# Patient Record
Sex: Female | Born: 1990 | Race: Black or African American | Hispanic: No | Marital: Single | Smoking: Never smoker
Health system: Southern US, Community
[De-identification: ages and names within clinical notes are randomized; demographics above are authoritative.]

---

## 2015-02-12 ENCOUNTER — Encounter (HOSPITAL_COMMUNITY): Payer: Self-pay | Admitting: Adult Health

## 2015-02-12 ENCOUNTER — Emergency Department (HOSPITAL_COMMUNITY)
Admission: EM | Admit: 2015-02-12 | Discharge: 2015-02-12 | Disposition: A | Discharge status: Home / Self Care | Payer: Medicaid Other | Attending: Emergency Medicine | Admitting: Emergency Medicine

## 2015-02-12 DIAGNOSIS — Y929 Unspecified place or not applicable: Secondary | ICD-10-CM | POA: Insufficient documentation

## 2015-02-12 DIAGNOSIS — Y93G3 Activity, cooking and baking: Secondary | ICD-10-CM | POA: Diagnosis not present

## 2015-02-12 DIAGNOSIS — T23131A Burn of first degree of multiple right fingers (nail), not including thumb, initial encounter: Secondary | ICD-10-CM

## 2015-02-12 DIAGNOSIS — Y998 Other external cause status: Secondary | ICD-10-CM | POA: Insufficient documentation

## 2015-02-12 DIAGNOSIS — X102XXA Contact with fats and cooking oils, initial encounter: Secondary | ICD-10-CM | POA: Diagnosis not present

## 2015-02-12 DIAGNOSIS — T23031A Burn of unspecified degree of multiple right fingers (nail), not including thumb, initial encounter: Secondary | ICD-10-CM | POA: Diagnosis present

## 2015-02-12 MED ORDER — TRAMADOL HCL 50 MG PO TABS: 50.0000 mg | ORAL_TABLET | Freq: Four times a day (QID) | ORAL | Status: DC | PRN

## 2015-02-12 MED ORDER — NEOMY-BACIT-POLYMYX-PRAMOXINE 1 % EX OINT: TOPICAL_OINTMENT | CUTANEOUS | Status: DC

## 2015-02-12 MED ORDER — IBUPROFEN 600 MG PO TABS: 600.0000 mg | ORAL_TABLET | Freq: Four times a day (QID) | ORAL | Status: DC | PRN

## 2015-02-12 MED ORDER — IBUPROFEN 200 MG PO TABS
600.0000 mg | ORAL_TABLET | Freq: Once | ORAL | Status: AC
  Administered 2015-02-12: 600 mg via ORAL
  Filled 2015-02-12: qty 3

## 2015-02-12 MED ORDER — OXYCODONE-ACETAMINOPHEN 5-325 MG PO TABS
1.0000 | ORAL_TABLET | Freq: Once | ORAL | Status: AC
  Administered 2015-02-12: 1 via ORAL
  Filled 2015-02-12: qty 1

## 2015-02-12 NOTE — Discharge Instructions (Signed)
Ibuprofen for pain. Tramadol for severe pain only. Apply Neosporin with pain relief topically to the fingertips 2-3 times a day. Follow-up with an urgent care or primary care doctor for recheck in 3 days.    Burn Care Your skin is a natural barrier to infection. It is the largest organ of your body. Burns damage this natural protection. To help prevent infection, it is very important to follow your caregiver's instructions in the care of your burn. Burns are classified as:  First degree. There is only redness of the skin (erythema). No scarring is expected.  Second degree. There is blistering of the skin. Scarring may occur with deeper burns.  Third degree. All layers of the skin are injured, and scarring is expected. HOME CARE INSTRUCTIONS   Wash your hands well before changing your bandage.  Change your bandage as often as directed by your caregiver.  Remove the old bandage. If the bandage sticks, you may soak it off with cool, clean water.  Cleanse the burn thoroughly but gently with mild soap and water.  Pat the area dry with a clean, dry cloth.  Apply a thin layer of antibacterial cream to the burn.  Apply a clean bandage as instructed by your caregiver.  Keep the bandage as clean and dry as possible.  Elevate the affected area for the first 24 hours, then as instructed by your caregiver.  Only take over-the-counter or prescription medicines for pain, discomfort, or fever as directed by your caregiver. SEEK IMMEDIATE MEDICAL CARE IF:   You develop excessive pain.  You develop redness, tenderness, swelling, or red streaks near the burn.  The burned area develops yellowish-white fluid (pus) or a bad smell.  You have a fever. MAKE SURE YOU:   Understand these instructions.  Will watch your condition.  Will get help right away if you are not doing well or get worse. Document Released: 09/16/2005 Document Revised: 12/09/2011 Document Reviewed: 02/06/2011 [facility]  Patient Information 2015 [place], [place]. This information is not intended to replace advice given to you by your health care provider. Make sure you discuss any questions you have with your health care provider.
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
## 2015-05-11 ENCOUNTER — Emergency Department (HOSPITAL_BASED_OUTPATIENT_CLINIC_OR_DEPARTMENT_OTHER): Payer: Medicaid Other

## 2015-05-11 ENCOUNTER — Emergency Department (HOSPITAL_BASED_OUTPATIENT_CLINIC_OR_DEPARTMENT_OTHER)
Admission: EM | Admit: 2015-05-11 | Discharge: 2015-05-11 | Disposition: A | Discharge status: Home / Self Care | Payer: Medicaid Other | Attending: Emergency Medicine | Admitting: Emergency Medicine

## 2015-05-11 ENCOUNTER — Encounter (HOSPITAL_BASED_OUTPATIENT_CLINIC_OR_DEPARTMENT_OTHER): Payer: Self-pay

## 2015-05-11 DIAGNOSIS — Z3202 Encounter for pregnancy test, result negative: Secondary | ICD-10-CM | POA: Diagnosis not present

## 2015-05-11 DIAGNOSIS — R197 Diarrhea, unspecified: Secondary | ICD-10-CM

## 2015-05-11 LAB — URINALYSIS, ROUTINE W REFLEX MICROSCOPIC
Bilirubin Urine: NEGATIVE
Glucose, UA: NEGATIVE mg/dL
Ketones, ur: NEGATIVE mg/dL
Leukocytes, UA: NEGATIVE
Nitrite: NEGATIVE
Protein, ur: NEGATIVE mg/dL
Specific Gravity, Urine: 1.005 (ref 1.005–1.030)
UROBILINOGEN UA: 0.2 mg/dL (ref 0.0–1.0)
pH: 7 (ref 5.0–8.0)

## 2015-05-11 LAB — URINE MICROSCOPIC-ADD ON

## 2015-05-11 LAB — CBC WITH DIFFERENTIAL/PLATELET
Basophils Absolute: 0 10*3/uL (ref 0.0–0.1)
Basophils Relative: 0 % (ref 0–1)
Eosinophils Absolute: 0.2 10*3/uL (ref 0.0–0.7)
Eosinophils Relative: 3 % (ref 0–5)
HCT: 35.2 % — ABNORMAL LOW (ref 36.0–46.0)
Hemoglobin: 11.9 g/dL — ABNORMAL LOW (ref 12.0–15.0)
Lymphocytes Relative: 58 % — ABNORMAL HIGH (ref 12–46)
Lymphs Abs: 3.6 10*3/uL (ref 0.7–4.0)
MCH: 29.8 pg (ref 26.0–34.0)
MCHC: 33.8 g/dL (ref 30.0–36.0)
MCV: 88 fL (ref 78.0–100.0)
Monocytes Absolute: 0.6 10*3/uL (ref 0.1–1.0)
Monocytes Relative: 10 % (ref 3–12)
NEUTROS ABS: 1.7 10*3/uL (ref 1.7–7.7)
NEUTROS PCT: 29 % — AB (ref 43–77)
PLATELETS: 262 10*3/uL (ref 150–400)
RBC: 4 MIL/uL (ref 3.87–5.11)
RDW: 12.8 % (ref 11.5–15.5)
WBC: 6.1 10*3/uL (ref 4.0–10.5)

## 2015-05-11 LAB — COMPREHENSIVE METABOLIC PANEL
ALBUMIN: 3.4 g/dL — AB (ref 3.5–5.0)
ALT: 11 U/L — ABNORMAL LOW (ref 14–54)
ANION GAP: 6 (ref 5–15)
AST: 15 U/L (ref 15–41)
Alkaline Phosphatase: 49 U/L (ref 38–126)
BUN: 8 mg/dL (ref 6–20)
CO2: 27 mmol/L (ref 22–32)
CREATININE: 0.49 mg/dL (ref 0.44–1.00)
Calcium: 8.4 mg/dL — ABNORMAL LOW (ref 8.9–10.3)
Chloride: 106 mmol/L (ref 101–111)
GFR calc Af Amer: >60 mL/min (ref >60)
GFR calc non Af Amer: >60 mL/min (ref >60)
Glucose, Bld: 110 mg/dL — ABNORMAL HIGH (ref 65–99)
POTASSIUM: 3.5 mmol/L (ref 3.5–5.1)
Sodium: 139 mmol/L (ref 135–145)
Total Bilirubin: 0.8 mg/dL (ref 0.3–1.2)
Total Protein: 6.2 g/dL — ABNORMAL LOW (ref 6.5–8.1)

## 2015-05-11 LAB — LIPASE, BLOOD: Lipase: 26 U/L (ref 22–51)

## 2015-05-11 LAB — PREGNANCY, URINE: Preg Test, Ur: NEGATIVE

## 2015-05-11 MED ORDER — ONDANSETRON HCL 4 MG/2ML IJ SOLN
4.0000 mg | Freq: Once | INTRAMUSCULAR | Status: AC
  Administered 2015-05-11: 4 mg via INTRAVENOUS
  Filled 2015-05-11: qty 2

## 2015-05-11 MED ORDER — PANTOPRAZOLE SODIUM 40 MG IV SOLR
40.0000 mg | Freq: Once | INTRAVENOUS | Status: AC
  Administered 2015-05-11: 40 mg via INTRAVENOUS
  Filled 2015-05-11: qty 40

## 2015-05-11 MED ORDER — SUCRALFATE 1 G PO TABS
1.0000 g | ORAL_TABLET | Freq: Once | ORAL | Status: AC
  Administered 2015-05-11: 1 g via ORAL
  Filled 2015-05-11: qty 1

## 2015-05-11 MED ORDER — LOPERAMIDE HCL 2 MG PO CAPS
2.0000 mg | ORAL_CAPSULE | Freq: Once | ORAL | Status: AC
  Administered 2015-05-11: 2 mg via ORAL
  Filled 2015-05-11: qty 1

## 2015-05-11 MED ORDER — SODIUM CHLORIDE 0.9 % IV BOLUS (SEPSIS)
1000.0000 mL | Freq: Once | INTRAVENOUS | Status: AC
  Administered 2015-05-11: 1000 mL via INTRAVENOUS

## 2015-05-11 NOTE — ED Notes (Signed)
Assumed care of patient from Katrina Morrow, Pt awaiting test results. VSS. No distress. No complaints.

## 2015-05-11 NOTE — ED Notes (Signed)
Patient transported to X-ray 

## 2015-05-11 NOTE — ED Notes (Signed)
MD at bedside. 

## 2015-05-11 NOTE — ED Provider Notes (Signed)
CSN: [id number]     Arrival date & time 05/11/15  0015 History   First MD Initiated Contact with Patient 05/11/15 0041     Chief Complaint  Patient presents with  . Diarrhea     (Consider location/radiation/quality/duration/timing/severity/associated sxs/prior Treatment) HPI  This is a 24 year old female who has had a cough for about a week. She was placed on an unspecified antibiotics which she has been taking for 5 days. The antibiotics is taken once daily. She was advised to take it on a full stomach but has been taking it on an empty stomach. She is here with a three-day history of diarrhea. The diarrhea is brown and watery. There is no blood she has also been nauseated but has not vomited. She has had a decreased appetite. She has also had a one-day history of epigastric and left upper quadrant pain. She has not had a fever.   History reviewed. No pertinent past medical history. History reviewed. No pertinent past surgical history. History reviewed. No pertinent family history. Social History  Substance Use Topics  . Smoking status: Never Smoker   . Smokeless tobacco: None  . Alcohol Use: No   OB History    No data available     Review of Systems  All other systems reviewed and are negative.   Allergies  Review of patient's allergies indicates no known allergies.  Home Medications   Prior to Admission medications   Medication Sig Start Date End Date Taking? Authorizing Provider  ibuprofen (ADVIL,MOTRIN) 600 MG tablet Take 1 tablet (600 mg total) by mouth every 6 (six) hours as needed. 02/12/15   Tatyana Kirichenko, PA-C  Neomy-Bacit-Polymyx-Pramoxine (NEOSPORIN + PAIN RELIEF MAX ST) 1 % OINT Apply to burned  Fingers twice a day. 02/12/15   Tatyana Kirichenko, PA-C  traMADol (ULTRAM) 50 MG tablet Take 1 tablet (50 mg total) by mouth every 6 (six) hours as needed. 02/12/15   Tatyana Kirichenko, PA-C   BP 100/64 mmHg  Pulse 88  Temp(Src) 98.3 F (36.8 C) (Oral)  Resp 18   Ht  (1.499 m)  Wt 125 lb (56.7 kg)  BMI 25.23 kg/m2  SpO2 99%  LMP 05/10/2015   Physical Exam  General: Well-developed, well-nourished female in no acute distress; appearance consistent with age of record HENT: normocephalic; atraumatic Eyes: pupils equal, round and reactive to light; extraocular muscles intact Neck: supple Heart: regular rate and rhythm Lungs: clear to auscultation bilaterally Abdomen: soft; nondistended; epigastric and left upper quadrant; no masses or hepatosplenomegaly; bowel sounds present Extremities: No deformity; full range of motion; pulses normal Neurologic: Awake, alert and oriented; motor function intact in all extremities and symmetric; no facial droop Skin: Warm and dry Psychiatric: Flat affect    ED Course  Procedures (including critical care time)   MDM  Nursing notes and vitals signs, including pulse oximetry, reviewed.  Summary of this visit's results, reviewed by myself:  Labs:  Results for orders placed or performed during the hospital encounter of 05/11/15 (from the past 24 hour(s))  CBC with Differential/Platelet     Status: Abnormal   Collection Time: 05/11/15 12:48 AM  Result Value Ref Range   WBC 6.1 4.0 - 10.5 K/uL   RBC 4.00 3.87 - 5.11 MIL/uL   Hemoglobin 11.9 (L) 12.0 - 15.0 g/dL   HCT 40.9 (L) 81.1 - 91.4 %   MCV 88.0 78.0 - 100.0 fL   MCH 29.8 26.0 - 34.0 pg   MCHC 33.8 30.0 - 36.0 g/dL  RDW 12.8 11.5 - 15.5 %   Platelets 262 150 - 400 K/uL   Neutrophils Relative % 29 (L) 43 - 77 %   Neutro Abs 1.7 1.7 - 7.7 K/uL   Lymphocytes Relative 58 (H) 12 - 46 %   Lymphs Abs 3.6 0.7 - 4.0 K/uL   Monocytes Relative 10 3 - 12 %   Monocytes Absolute 0.6 0.1 - 1.0 K/uL   Eosinophils Relative 3 0 - 5 %   Eosinophils Absolute 0.2 0.0 - 0.7 K/uL   Basophils Relative 0 0 - 1 %   Basophils Absolute 0.0 0.0 - 0.1 K/uL  Lipase, blood     Status: None   Collection Time: 05/11/15 12:48 AM  Result Value Ref Range   Lipase 26 22 -  51 U/L  Comprehensive metabolic panel     Status: Abnormal   Collection Time: 05/11/15 12:48 AM  Result Value Ref Range   Sodium 139 135 - 145 mmol/L   Potassium 3.5 3.5 - 5.1 mmol/L   Chloride 106 101 - 111 mmol/L   CO2 27 22 - 32 mmol/L   Glucose, Bld 110 (H) 65 - 99 mg/dL   BUN 8 6 - 20 mg/dL   Creatinine, Ser 9.60 0.44 - 1.00 mg/dL   Calcium 8.4 (L) 8.9 - 10.3 mg/dL   Total Protein 6.2 (L) 6.5 - 8.1 g/dL   Albumin 3.4 (L) 3.5 - 5.0 g/dL   AST 15 15 - 41 U/L   ALT 11 (L) 14 - 54 U/L   Alkaline Phosphatase 49 38 - 126 U/L   Total Bilirubin 0.8 0.3 - 1.2 mg/dL   GFR calc non Af Amer >60 >60 mL/min   GFR calc Af Amer >60 >60 mL/min   Anion gap 6 5 - 15  Urinalysis, Routine w reflex microscopic (not at [place])     Status: Abnormal   Collection Time: 05/11/15  1:45 AM  Result Value Ref Range   Color, Urine YELLOW YELLOW   APPearance CLEAR CLEAR   Specific Gravity, Urine 1.005 1.005 - 1.030   pH 7.0 5.0 - 8.0   Glucose, UA NEGATIVE NEGATIVE mg/dL   Hgb urine dipstick TRACE (A) NEGATIVE   Bilirubin Urine NEGATIVE NEGATIVE   Ketones, ur NEGATIVE NEGATIVE mg/dL   Protein, ur NEGATIVE NEGATIVE mg/dL   Urobilinogen, UA 0.2 0.0 - 1.0 mg/dL   Nitrite NEGATIVE NEGATIVE   Leukocytes, UA NEGATIVE NEGATIVE  Pregnancy, urine     Status: None   Collection Time: 05/11/15  1:45 AM  Result Value Ref Range   Preg Test, Ur NEGATIVE NEGATIVE  Urine microscopic-add on     Status: None   Collection Time: 05/11/15  1:45 AM  Result Value Ref Range   Squamous Epithelial / LPF RARE RARE   WBC, UA 0-2 <3 WBC/hpf   RBC / HPF 0-2 <3 RBC/hpf   Bacteria, UA RARE RARE    Imaging Studies: Dg Chest 2 View  05/11/2015   CLINICAL DATA:  Acute onset of generalized abdominal pain and diarrhea. Cough. Initial encounter.  EXAM: CHEST  2 VIEW  COMPARISON:  None.  FINDINGS: The lungs are well-aerated and clear. There is no evidence of focal opacification, pleural effusion or pneumothorax.  The heart is normal  in size; the mediastinal contour is within normal limits. No acute osseous abnormalities are seen.  IMPRESSION: No acute cardiopulmonary process seen.   Electronically Signed   By: Roanna Raider M.D.   On: 05/11/2015 02:15  2:29 AM Patient feeling much better after IV fluids and Carafate. She was advised to take her antibiotics with food as instructed. It's likely she is having some gastritis related to the antibiotic. She was advised to take over-the-counter Imodium as needed for diarrhea.    Paula Libra, MD 05/11/15 0230

## 2015-05-11 NOTE — ED Notes (Addendum)
Pt reports diarrhea, nausea since yesterday. Pt also c/o cough and being on antibiotic and 3 other medications for cough but cannot recall the name of any of the medications.

## 2016-06-25 ENCOUNTER — Encounter (HOSPITAL_COMMUNITY): Payer: Self-pay | Admitting: Emergency Medicine

## 2016-06-25 ENCOUNTER — Emergency Department (HOSPITAL_COMMUNITY)
Admission: EM | Admit: 2016-06-25 | Discharge: 2016-06-25 | Disposition: A | Discharge status: Home / Self Care | Payer: Medicaid Other | Attending: Emergency Medicine | Admitting: Emergency Medicine

## 2016-06-25 DIAGNOSIS — H6121 Impacted cerumen, right ear: Secondary | ICD-10-CM

## 2016-06-25 DIAGNOSIS — J069 Acute upper respiratory infection, unspecified: Secondary | ICD-10-CM

## 2016-06-25 DIAGNOSIS — R059 Cough, unspecified: Secondary | ICD-10-CM

## 2016-06-25 DIAGNOSIS — R05 Cough: Secondary | ICD-10-CM

## 2016-06-25 DIAGNOSIS — H9201 Otalgia, right ear: Secondary | ICD-10-CM | POA: Diagnosis present

## 2016-06-25 MED ORDER — SODIUM CHLORIDE 0.9 % IV BOLUS (SEPSIS)
1000.0000 mL | Freq: Once | INTRAVENOUS | Status: AC
Start: 2016-06-25 — End: 2016-06-25
  Administered 2016-06-25: 1000 mL via INTRAVENOUS

## 2016-06-25 MED ORDER — BENZONATATE 100 MG PO CAPS: 100.0000 mg | ORAL_CAPSULE | Freq: Three times a day (TID) | ORAL | 0 refills | Status: AC | PRN

## 2016-06-25 MED ORDER — ONDANSETRON HCL 4 MG/2ML IJ SOLN
4.0000 mg | Freq: Once | INTRAMUSCULAR | Status: AC
  Administered 2016-06-25: 4 mg via INTRAVENOUS
  Filled 2016-06-25: qty 2

## 2016-06-25 MED ORDER — ACETAMINOPHEN 500 MG PO TABS
1000.0000 mg | ORAL_TABLET | Freq: Once | ORAL | Status: AC
  Administered 2016-06-25: 1000 mg via ORAL
  Filled 2016-06-25: qty 2

## 2016-06-25 MED ORDER — ONDANSETRON HCL 4 MG PO TABS: 4.0000 mg | ORAL_TABLET | Freq: Three times a day (TID) | ORAL | 0 refills | Status: AC | PRN

## 2016-06-25 MED ORDER — HYDROGEN PEROXIDE 3 % EX SOLN
Freq: Once | CUTANEOUS | Status: AC
  Administered 2016-06-25: 09:00:00 via TOPICAL
  Filled 2016-06-25: qty 473

## 2016-06-25 NOTE — ED Triage Notes (Signed)
Pt. reports persistent dry cough with chest congestion and right ear ache onset last week .

## 2016-06-25 NOTE — ED Provider Notes (Signed)
[facility] Provider Note   CSN: [id number] Arrival date & time: 06/25/16  7829     History   Chief Complaint Chief Complaint  Patient presents with  . Cough  . Otalgia    HPI Katrina Morrow is a 25 y.o. female.  The history is provided by the patient.  Otalgia  This is a new problem. The current episode started 2 days ago. There is pain in the right ear. The problem occurs constantly. The problem has been gradually worsening. The maximum temperature recorded prior to her arrival was 100 to 100.9 F. The fever has been present for 1 to 2 days. The pain is moderate. Associated symptoms include sore throat and cough. Pertinent negatives include no ear discharge, no headaches, no abdominal pain, no diarrhea, no vomiting and no rash.    History reviewed. No pertinent past medical history.  There are no active problems to display for this patient.   History reviewed. No pertinent surgical history.  OB History    No data available       Home Medications    Prior to Admission medications   Not on File    Family History No family history on file.  Social History Social History  Substance Use Topics  . Smoking status: Never Smoker  . Smokeless tobacco: Never Used  . Alcohol use No     Allergies   Review of patient's allergies indicates no known allergies.   Review of Systems Review of Systems  Constitutional: Positive for fever. Negative for chills.  HENT: Positive for ear pain and sore throat. Negative for ear discharge.   Eyes: Negative for pain and visual disturbance.  Respiratory: Positive for cough. Negative for shortness of breath.   Cardiovascular: Negative for chest pain and palpitations.  Gastrointestinal: Positive for nausea. Negative for abdominal pain, diarrhea and vomiting.  Genitourinary: Negative for dysuria and hematuria.  Musculoskeletal: Negative for arthralgias and back pain.  Skin: Negative for color change and rash.  Neurological:  Positive for light-headedness. Negative for seizures, syncope and headaches.  All other systems reviewed and are negative.    Physical Exam Updated Vital Signs BP 105/68 (BP Location: Right Arm)   Pulse 73   Temp 100.4 F (38 C) (Oral)   Resp 16   Ht 5\' 1"  (1.549 m)   Wt 62.6 kg   LMP 06/21/2016   SpO2 100%   BMI 26.07 kg/m   Physical Exam  Constitutional: She is oriented to person, place, and time. She appears well-developed and well-nourished. No distress.  HENT:  Head: Normocephalic and atraumatic.  Bilateral cerumen impaction. After right ear cerumen removal, TM was inspected and intact without signs of infection.  Eyes: Conjunctivae and EOM are normal. Pupils are equal, round, and reactive to light.  Neck: Neck supple.  Cardiovascular: Normal rate and regular rhythm.   Pulmonary/Chest: Effort normal and breath sounds normal. No respiratory distress.  Abdominal: Soft. There is no tenderness.  Musculoskeletal: She exhibits no edema.  Neurological: She is alert and oriented to person, place, and time.  Skin: Skin is warm and dry.  Psychiatric: She has a normal mood and affect.  Nursing note and vitals reviewed.    ED Treatments / Results  Labs (all labs ordered are listed, but only abnormal results are displayed) Labs Reviewed - No data to display  EKG  EKG Interpretation  Date/Time:  Tuesday June 25 2016 08:47:00 EDT Ventricular Rate:  63 PR Interval:    QRS Duration: 91 QT  Interval:  445 QTC Calculation: 456 R Axis:   74 Text Interpretation:  Sinus rhythm Borderline Q waves in inferior leads Borderline T abnormalities, anterior leads no previous Confirmed by ZACKOWSKI  MD, SCOTT [phone]([id number]) on 06/25/2016 8:50:12 AM Also confirmed by Deretha EmoryZACKOWSKI  MD, SCOTT [phone]), editor Whitney PostLOGAN, Cala BradfordKIMBERLY [phone])  on 06/25/2016 9:28:12 AM       Radiology No results found.  Procedures .Ear Cerumen Removal Date/Time: 06/25/2016 9:36 AM Performed by: Garey HamEAN, Loneta Tamplin  E Authorized by: Jaquita RectorEAN, Clarrissa Shimkus E   Consent:    Consent obtained:  Verbal   Consent given by:  Patient Procedure details:    Location:  R ear   Procedure type: irrigation     Procedure type comment:  And curette Post-procedure details:    Inspection:  TM intact   Hearing quality:  Normal   Patient tolerance of procedure:  Tolerated well, no immediate complications   (including critical care time)  Medications Ordered in ED Medications  sodium chloride 0.9 % bolus 1,000 mL (not administered)  ondansetron (ZOFRAN) injection 4 mg (not administered)  hydrogen peroxide 3 % external solution (not administered)     Initial Impression / Assessment and Plan / ED Course  I have reviewed the triage vital signs and the nursing notes.  Pertinent labs & imaging results that were available during my care of the patient were reviewed by me and considered in my medical decision making (see chart for details).  Clinical Course   Ms. Kandis MannanOmar presents for 2 days of cough, fever, light headedness, anorexia and right sided otalgia.  Patient given oral pain medication, IV fluids, and zofran with relief of symptoms. No longer lightheaded or nauseated.   EKG obtained, no concerning features.  Patient likely has URI with cough and nausea.  Right ear was disimpacted as above and found to have no signs of infection.  Patient requested to not have left ear cleaned; is not having pain to the left ear.  Patient is discharged with strict return precautions, follow up instructions, educational materials and prescriptions for zofran and tessalon pearls.  Final Clinical Impressions(s) / ED Diagnoses   Final diagnoses:  Cerumen impaction, right  URI (upper respiratory infection)  Cough    New Prescriptions New Prescriptions   BENZONATATE (TESSALON) 100 MG CAPSULE    Take 1 capsule (100 mg total) by mouth 3 (three) times daily as needed for cough.   ONDANSETRON (ZOFRAN) 4 MG TABLET    Take 1 tablet (4 mg  total) by mouth every 8 (eight) hours as needed for nausea or vomiting.     Garey HamMichael E Nyra Anspaugh, MD 06/25/16 [phone]    Vanetta MuldersScott Zackowski, MD 06/25/16 [phone]    Vanetta MuldersScott Zackowski, MD 06/25/16 [phone]

## 2017-02-19 IMAGING — CR DG CHEST 2V
2 series · 2 of 2 positions shown · non-contrast
Comparison: None.

CLINICAL DATA: Acute onset of generalized abdominal pain and
diarrhea. Cough. Initial encounter.

EXAM:
CHEST  2 VIEW

[w chest pa]
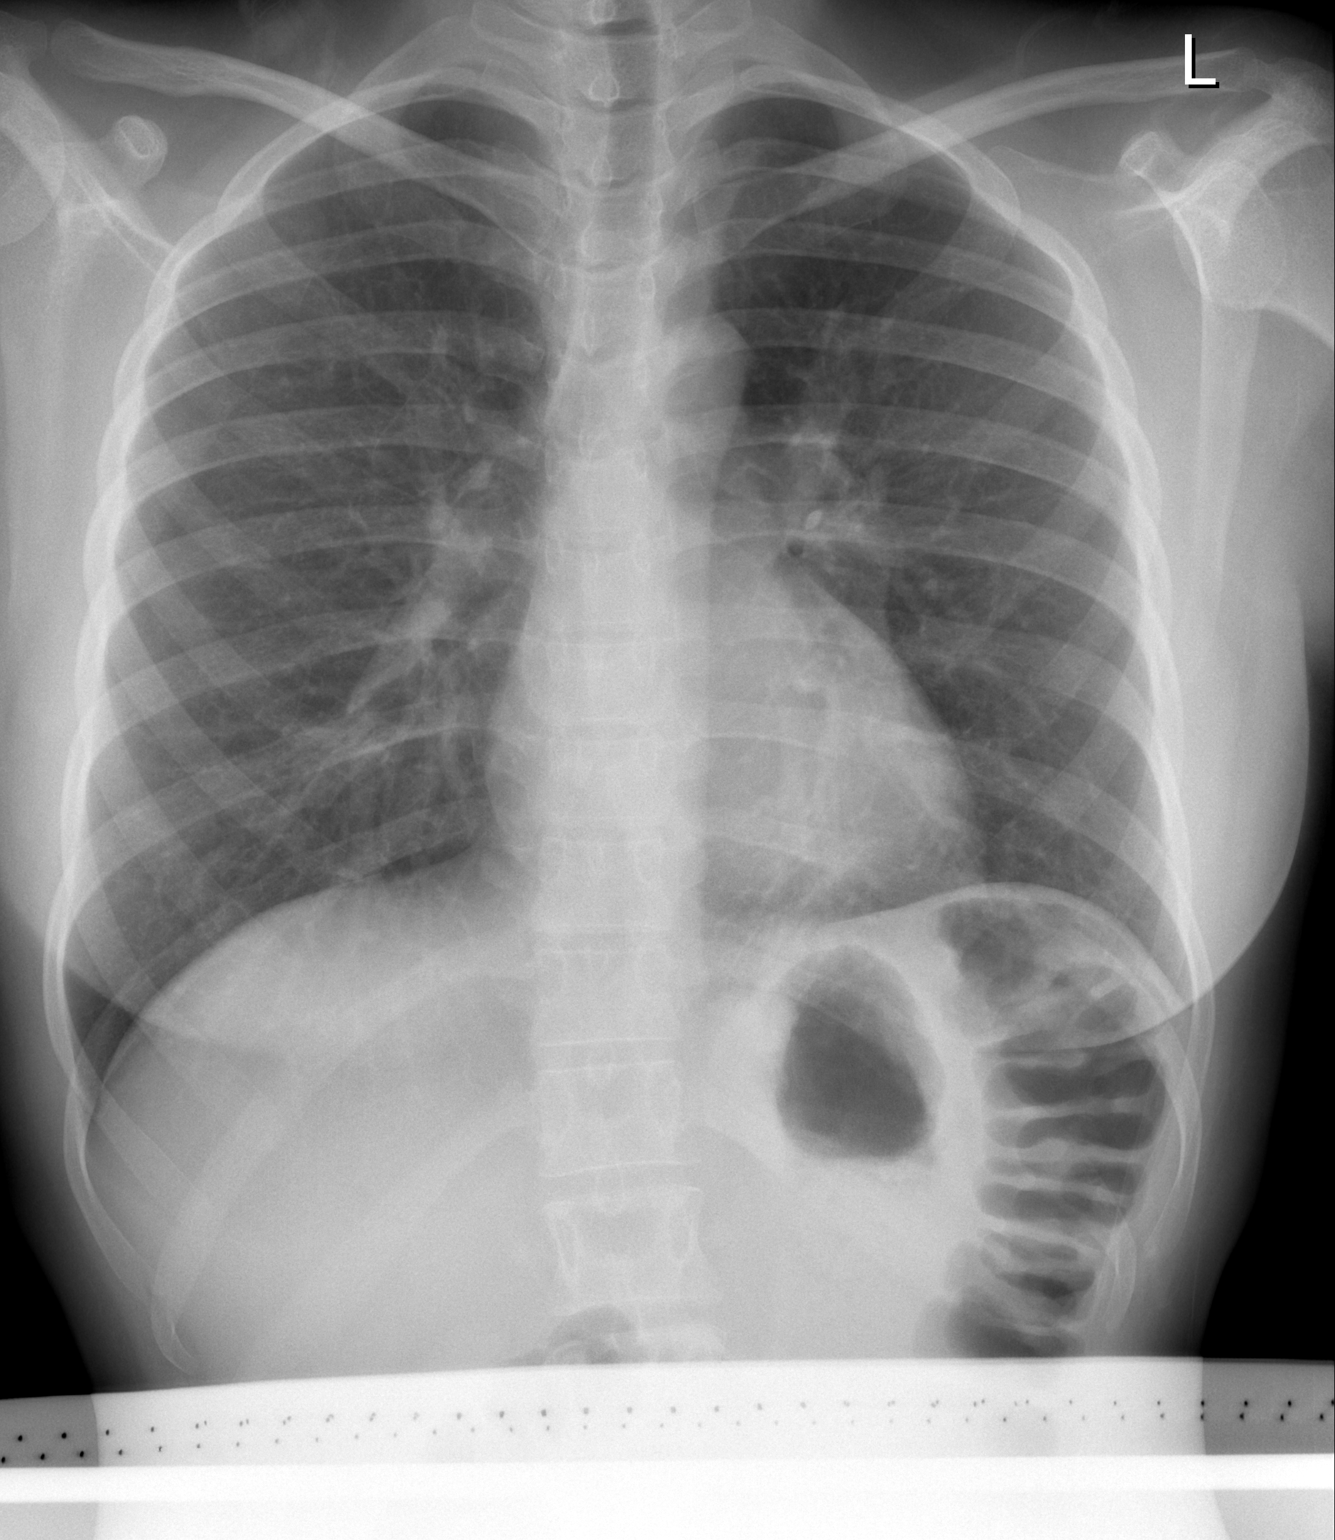

[w chest lat]
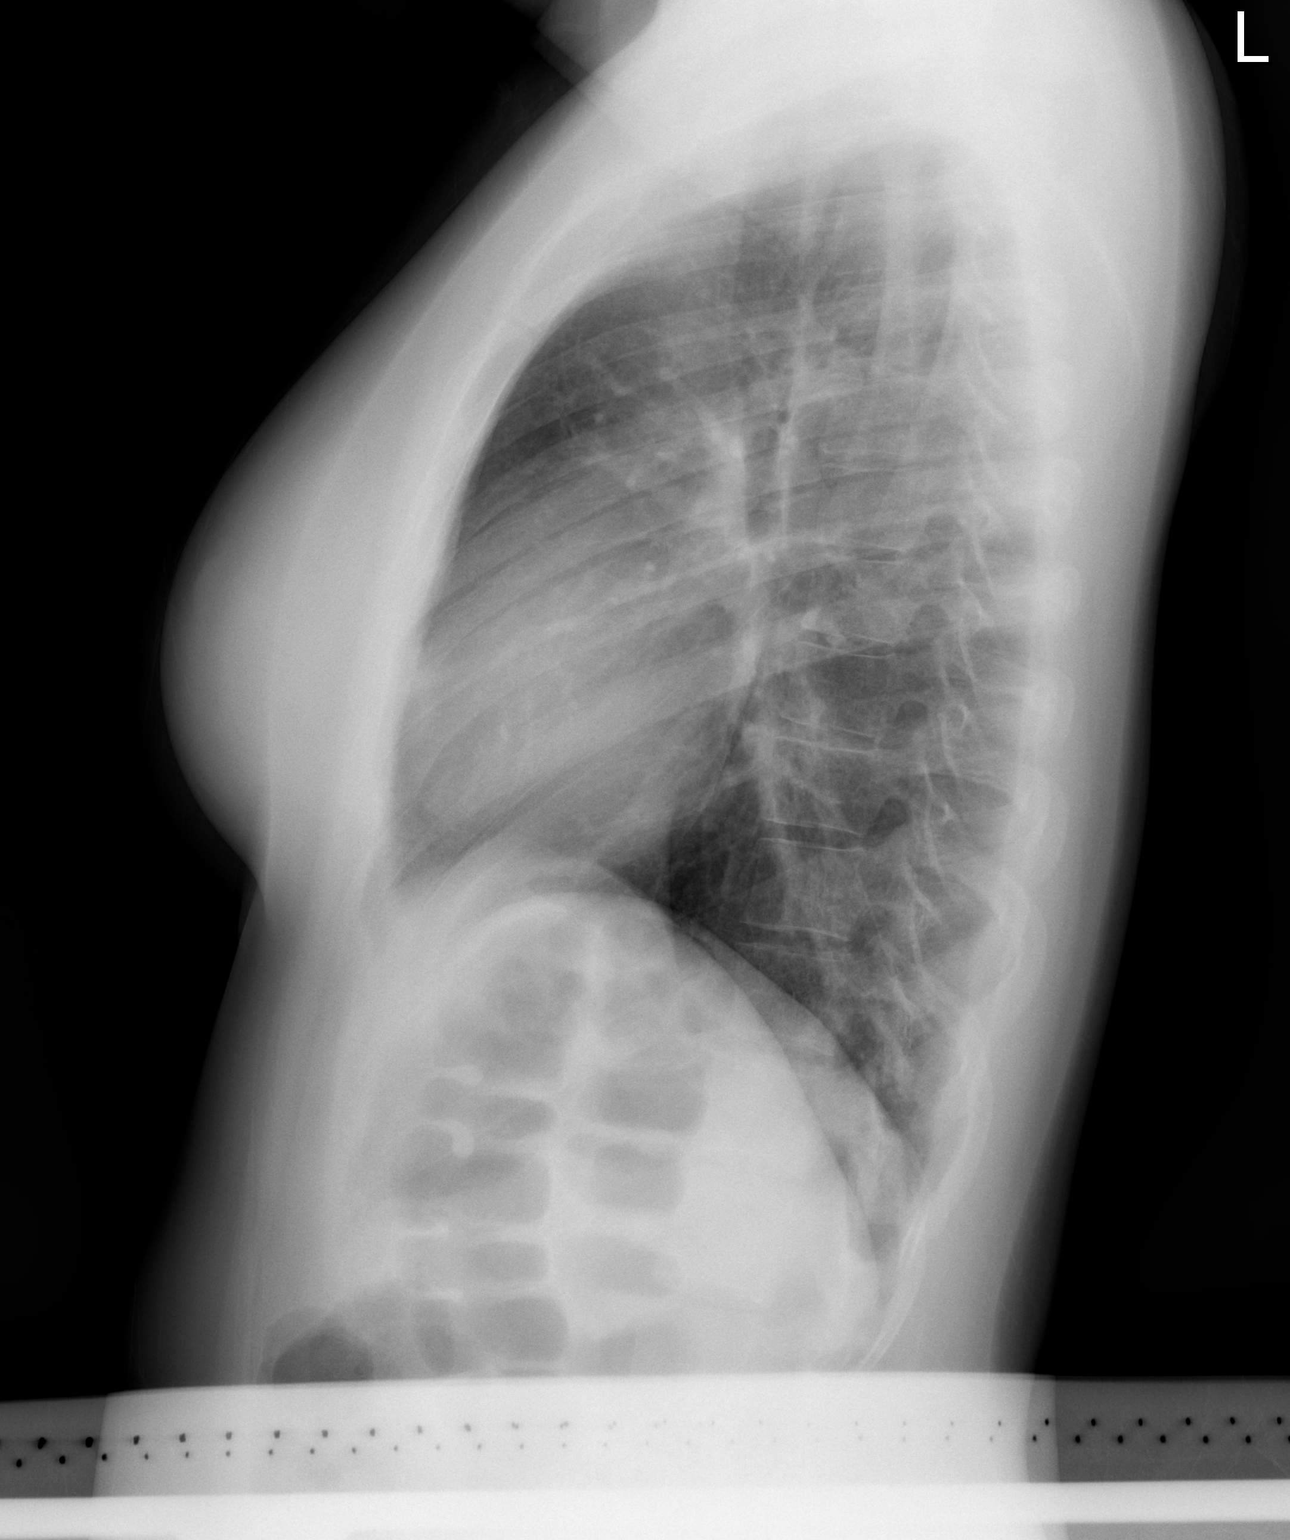

[2 of 2 positions shown; findings below may reference images not displayed]

FINDINGS: The lungs are well-aerated and clear. There is no evidence of focal
opacification, pleural effusion or pneumothorax.

The heart is normal in size; the mediastinal contour is within
normal limits. No acute osseous abnormalities are seen.
IMPRESSION: No acute cardiopulmonary process seen.

## 2017-06-02 ENCOUNTER — Ambulatory Visit (HOSPITAL_COMMUNITY)
Admission: EM | Admit: 2017-06-02 | Discharge: 2017-06-02 | Disposition: A | Discharge status: Home / Self Care | Payer: Self-pay

## 2017-06-02 ENCOUNTER — Encounter (HOSPITAL_COMMUNITY): Payer: Self-pay | Admitting: Emergency Medicine

## 2017-06-02 DIAGNOSIS — R197 Diarrhea, unspecified: Secondary | ICD-10-CM

## 2017-06-02 DIAGNOSIS — R110 Nausea: Secondary | ICD-10-CM

## 2017-06-02 DIAGNOSIS — B54 Unspecified malaria: Secondary | ICD-10-CM

## 2017-06-02 MED ORDER — ARTEMETHER-LUMEFANTRINE 20-120 MG PO TABS: 4.0000 | ORAL_TABLET | Freq: Two times a day (BID) | ORAL | 0 refills | Status: AC

## 2017-06-02 NOTE — ED Provider Notes (Signed)
  [facility]   [id number] 06/02/17 Arrival Time: 1640  ASSESSMENT & PLAN:  1. Malaria   2. Nausea without vomiting   3. Diarrhea, unspecified type     Meds ordered this encounter  Medications  . UNABLE TO FIND    Sig: Inject 80 mg as directed 2 (two) times daily. Med Name: Artemether  . UNABLE TO FIND    Sig: Take 1 tablet by mouth as needed. Med Name: [place]  . UNABLE TO FIND    Sig: Take 1 tablet by mouth as needed. Med Name: Amidol  . artemether-lumefantrine (COARTEM) 20-120 MG TABS tablet    Sig: Take 4 tablets by mouth 2 (two) times daily.    Dispense:  24 tablet    Refill:  0    Order Specific Question:   Supervising Provider    Answer:   Eustace MooreMURRAY, LAURA W [[id number]]    Reviewed expectations re: course of current medical issues. Questions answered. Outlined signs and symptoms indicating need for more acute intervention. Patient verbalized understanding. After Visit Summary given.   SUBJECTIVE:  Katrina Morrow is a 26 y.o. female who presents with complaint of diarrhea, nausea, weakness, and fever and sweats.  She [place] and [place] dx with malaria and had blood test ( finger stick) and was started on Coartem IM series and she did not take the last 2 injections and feels like her Malaria may be back.  She is currently taking a headache medicine and anti bowel spasm medicine.  She reports having diarrhea.  She did take the malaria prophylaxis.  ROS: As per HPI.   OBJECTIVE:  Vitals:   06/02/17 1726  BP: 99/67  Pulse: 70  Temp: (!) 97.5 F (36.4 C)  TempSrc: Oral  SpO2: 100%    General appearance: alert; no distress Eyes: PERRLA; EOMI; conjunctiva normal HENT: normocephalic; atraumatic; TMs normal; nasal mucosa normal; oral mucosa normal Neck: supple Lungs: clear to auscultation bilaterally Heart: regular rate and rhythm Abdomen: soft, non-tender; bowel sounds normal; no masses or organomegaly; no guarding or rebound  tenderness Back: no CVA tenderness Extremities: no cyanosis or edema; symmetrical with no gross deformities Skin: warm and dry Neurologic: normal gait; normal symmetric reflexes Psychological: alert and cooperative; normal mood and affect  Labs:  Labs Reviewed - No data to display  Imaging: No results found.  No Known Allergies  History reviewed. No pertinent past medical history. Social History   Social History  . Marital status: Single    Spouse name: N/A  . Number of children: N/A  . Years of education: N/A   Occupational History  . Not on file.   Social History Main Topics  . Smoking status: Never Smoker  . Smokeless tobacco: Never Used  . Alcohol use No  . Drug use: No  . Sexual activity: Not on file   Other Topics Concern  . Not on file   Social History Narrative  . No narrative on file   History reviewed. No pertinent family history. History reviewed. No pertinent surgical history.   [name], [name], OregonFNP 06/02/17 [phone]

## 2017-06-02 NOTE — ED Triage Notes (Addendum)
Pt was seen and [place] two weeks ago for malaria.  She did not complete the antiparasite medication and has two prescriptions for headaches and diarrhea.  Pt states her symptoms have not gotten better.  She has been [place].  She reports continued diarrhea, dizziness, headache and shakiness from her abdomen to her knees.

## 2017-06-09 ENCOUNTER — Encounter: Payer: Self-pay | Admitting: Family Medicine

## 2017-06-09 ENCOUNTER — Ambulatory Visit (INDEPENDENT_AMBULATORY_CARE_PROVIDER_SITE_OTHER): Payer: Self-pay | Admitting: Family Medicine

## 2017-06-09 VITALS — BP 110/75 | HR 82 | Temp 97.3°F | Resp 17 | Ht 61.0 in | Wt 129.6 lb

## 2017-06-09 DIAGNOSIS — R197 Diarrhea, unspecified: Secondary | ICD-10-CM

## 2017-06-09 NOTE — Patient Instructions (Signed)
     IF you received an x-ray today, you will receive an invoice from [facility]. Please contact [facility] at [phone] with questions or concerns regarding your invoice.   IF you received labwork today, you will receive an invoice from [facility]. Please contact [facility] at [phone] with questions or concerns regarding your invoice.   Our billing staff will not be able to assist you with questions regarding bills from these companies.  You will be contacted with the lab results as soon as they are available. The fastest way to get your results is to activate your My Chart account. Instructions are located on the last page of this paperwork. If you have not heard from us regarding the results in 2 weeks, please contact this office.     

## 2017-06-09 NOTE — Progress Notes (Signed)
  Chief Complaint  Patient presents with  . New Patient (Initial Visit)    diarrhea x 3-4 months    HPI  Pt reports that she is here for an initial visit.   She is taking an antimalarial drug called Coartem In [place] she got an injection for malaria She just arrived to the [place] from [place] 05/22/17  She reports that she has been having diarrhea if she eats or drinks water She has had diarrhea 6-7 times She reports she also get hyperactive bs No cramping No blood in the bowel movements Sometimes she feels feverish  She was previously diagnosed with malaria and was treated in [place]  No past medical history on file.  Current Outpatient Prescriptions  Medication Sig Dispense Refill  . artemether-lumefantrine (COARTEM) 20-120 MG TABS tablet Take 4 tablets by mouth 2 (two) times daily. 24 tablet 0  . UNABLE TO FIND Inject 80 mg as directed 2 (two) times daily. Med Name: Artemether    . UNABLE TO FIND Take 1 tablet by mouth as needed. Med Name: [place]    . UNABLE TO FIND Take 1 tablet by mouth as needed. Med Name: Amidol    . benzonatate (TESSALON) 100 MG capsule Take 1 capsule (100 mg total) by mouth 3 (three) times daily as needed for cough. 10 capsule 0  . ondansetron (ZOFRAN) 4 MG tablet Take 1 tablet (4 mg total) by mouth every 8 (eight) hours as needed for nausea or vomiting. (Patient not taking: Reported on 06/09/2017) 4 tablet 0   No current facility-administered medications for this visit.     Allergies: Not on File  No past surgical history on file.  Social History   Social History  . Marital status: Single    Spouse name: N/A  . Number of children: N/A  . Years of education: N/A   Social History Main Topics  . Smoking status: Never Smoker  . Smokeless tobacco: Never Used  . Alcohol use No  . Drug use: No  . Sexual activity: Not Asked   Other Topics Concern  . None   Social History Narrative  . None    ROS See hpi  Objective: Vitals:   06/09/17 1704    BP: 110/75  Pulse: 82  Resp: 17  Temp: (!) 97.3 F (36.3 C)  TempSrc: Oral  SpO2: 98%  Weight: 129 lb 9.6 oz (58.8 kg)  Height: 5\' 1"  (1.549 m)    Physical Exam  Constitutional: She is oriented to person, place, and time. She appears well-developed and well-nourished.  HENT:  Head: Normocephalic and atraumatic.  Eyes: Conjunctivae and EOM are normal.  Cardiovascular: Normal rate, regular rhythm and normal heart sounds.   Pulmonary/Chest: Effort normal and breath sounds normal. No respiratory distress. She has no wheezes.  Abdominal: Soft. Bowel sounds are normal. She exhibits no distension and no mass. There is no tenderness. There is no rebound and no guarding. No hernia.  Neurological: She is alert and oriented to person, place, and time.  Psychiatric: She has a normal mood and affect. Her behavior is normal. Judgment and thought content normal.    Assessment and Plan Shalona was seen today for new patient (initial visit).  Diagnoses and all orders for this visit:  Diarrhea, unspecified type- advised to follow up with the health department Since C. Diff is negative advised imodium  -     Clostridium Difficile by PCR     Aimie Wagman A Creta LevinStallings

## 2017-06-10 LAB — CLOSTRIDIUM DIFFICILE BY PCR: Toxigenic C. Difficile by PCR: NEGATIVE

## 2017-06-11 ENCOUNTER — Telehealth: Payer: Self-pay | Admitting: *Deleted
